# Patient Record
Sex: Female | Born: 2000 | Race: Black or African American | Hispanic: No | Marital: Single
Health system: Southern US, Community
[De-identification: ages and names within clinical notes are randomized; demographics above are authoritative.]

---
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
## 2019-11-06 ENCOUNTER — Ambulatory Visit: Payer: Medicaid Other | Attending: Internal Medicine

## 2019-11-06 DIAGNOSIS — Z23 Encounter for immunization: Secondary | ICD-10-CM

## 2019-11-06 NOTE — Progress Notes (Signed)
   Covid-19 Vaccination Clinic  Name:  Jacqueline Ritter    MRN: [id number] DOB: 02-May-2001  11/06/2019  Ms. Jacqueline Ritter was observed post Covid-19 immunization for 15 minutes without incident. She was provided with Vaccine Information Sheet and instruction to access the V-Safe system.   Ms. Jacqueline Ritter was instructed to call 911 with any severe reactions post vaccine: Marland Kitchen Difficulty breathing  . Swelling of face and throat  . A fast heartbeat  . A bad rash all over body  . Dizziness and weakness   Immunizations Administered    Name Date Dose VIS Date Route   Pfizer COVID-19 Vaccine 11/06/2019  3:41 PM 0.3 mL 07/09/2019 Intramuscular   Manufacturer: [employer], [employer]   Lot: [id number]   NDC: [address]-2

## 2019-11-30 ENCOUNTER — Ambulatory Visit: Payer: Medicaid Other | Attending: Internal Medicine

## 2019-11-30 DIAGNOSIS — Z23 Encounter for immunization: Secondary | ICD-10-CM

## 2019-11-30 NOTE — Progress Notes (Signed)
   Covid-19 Vaccination Clinic  Name:  [name]    MRN: [id number] DOB: 01-02-2001  11/30/2019  [name] was observed post Covid-19 immunization for 15 minutes without incident. She was provided with Vaccine Information Sheet and instruction to access the V-Safe system.   [name] was instructed to call 911 with any severe reactions post vaccine: Marland Kitchen Difficulty breathing  . Swelling of face and throat  . A fast heartbeat  . A bad rash all over body  . Dizziness and weakness   Immunizations Administered    Name Date Dose VIS Date Route   Pfizer COVID-19 Vaccine 11/30/2019 10:03 AM 0.3 mL 09/22/2018 Intramuscular   Manufacturer: [employer], [employer]   Lot: [id number]   NDC: [address]-1

## 2023-07-05 ENCOUNTER — Inpatient Hospital Stay: Payer: Medicaid Other

## 2023-07-05 ENCOUNTER — Inpatient Hospital Stay: Payer: Medicaid Other | Attending: Hematology and Oncology | Admitting: Hematology and Oncology

## 2023-07-05 ENCOUNTER — Encounter: Payer: Self-pay | Admitting: Hematology and Oncology

## 2023-07-05 VITALS — BP 126/81 | HR 100 | Temp 98.8°F | Resp 16 | Ht 66.0 in | Wt 179.4 lb

## 2023-07-05 DIAGNOSIS — E559 Vitamin D deficiency, unspecified: Secondary | ICD-10-CM | POA: Insufficient documentation

## 2023-07-05 DIAGNOSIS — N920 Excessive and frequent menstruation with regular cycle: Secondary | ICD-10-CM | POA: Diagnosis not present

## 2023-07-05 DIAGNOSIS — D509 Iron deficiency anemia, unspecified: Secondary | ICD-10-CM

## 2023-07-05 LAB — IRON AND IRON BINDING CAPACITY (CC-WL,HP ONLY)
Iron: 15 ug/dL — ABNORMAL LOW (ref 28–170)
Saturation Ratios: 3 % — ABNORMAL LOW (ref 10.4–31.8)
TIBC: 458 ug/dL — ABNORMAL HIGH (ref 250–450)
UIBC: 443 ug/dL — ABNORMAL HIGH (ref 148–442)

## 2023-07-05 LAB — FERRITIN: Ferritin: 3 ng/mL — ABNORMAL LOW (ref 11–307)

## 2023-07-05 NOTE — Progress Notes (Signed)
[name] CONSULT NOTE  Patient Care Team: Christel Mormon, MD as PCP - General (Pediatrics)  CHIEF COMPLAINTS/PURPOSE OF CONSULTATION:  IDA  ASSESSMENT & PLAN:  No problem-specific Assessment & Plan notes found for this encounter.  Orders Placed This Encounter  Procedures   Iron and Iron Binding Capacity ([facility] only)    Standing Status:   Future    Standing Expiration Date:   07/04/2024   Ferritin    Standing Status:   Future    Standing Expiration Date:   07/04/2024   This is a pleasant 22 year old premenopausal female patient with no significant past medical history except for anemia referred to hematology for evaluation and recommendations regarding the same.  Iron Deficiency Anemia Asymptomatic, no pica. Likely secondary to heavy menstruation. Previously responsive to oral iron supplementation. No indication for iron infusion. -Start over-the-counter iron pills (65mg /325mg  formulation) daily.  Discussed adverse effects including dyspepsia, constipation, black stool. -Draw iron panel and ferritin today for baseline. -Return in 3-4 months for follow-up and repeat labs.  Menorrhagia Likely contributing to iron deficiency anemia. -Consult with gynecologist to discuss management options to reduce menstrual bleeding.  Vitamin D Deficiency Currently on Vitamin D 50,000 units weekly. -Continue current regimen.  She expressed understanding of all the recommendations.  Thank you for consulting [place] in the care of this patient.  Please do not hesitate to contact us with any additional questions or concerns  HISTORY OF PRESENTING ILLNESS:  Jacqueline Ritter 22 y.o. female is here because of IDA  The patient, with a history of iron deficiency anemia, presents for evaluation of her anemia. She reports no specific symptoms related to her anemia and does not have any cravings for ice. She was referred for iron infusion due to severe anemia detected on recent blood work.  However, she does not report any severe symptoms such as feeling like she is going to pass out, SOB with exertion.She is going to the gym and is able to do a work out. She has previously taken iron pills for her anemia and is willing to take them again. She also reports taking Vitamin D supplements once a week. She denies any other major health issues or surgeries. She does not take any other medications apart from the Vitamin D. She reports no bleeding from anywhere else other than heavy menstrual cycles and consumes meat in her diet. She denies any hair loss or brittle nails. Rest of the pertinent 10 point ROS reviewed and neg.  MEDICAL HISTORY:  No past medical history on file.  SURGICAL HISTORY: No past surgical history on file.  SOCIAL HISTORY: Social History   Socioeconomic History   Marital status: Single    Spouse name: Not on file   Number of children: Not on file   Years of education: Not on file   Highest education level: Not on file  Occupational History   Not on file  Tobacco Use   Smoking status: Never    Passive exposure: Yes   Smokeless tobacco: Never  Substance and Sexual Activity   Alcohol use: No   Drug use: Never   Sexual activity: Not Currently  Other Topics Concern   Not on file  Social History Narrative   Not on file   Social Determinants of Health   Financial Resource Strain: Not on file  Food Insecurity: No Food Insecurity (07/05/2023)   Hunger Vital Sign    Worried About Running Out of Food in the Last Year: Never true  Ran Out of Food in the Last Year: Never true  Transportation Needs: No Transportation Needs (07/05/2023)   PRAPARE - Administrator, Civil Service (Medical): No    Lack of Transportation (Non-Medical): No  Physical Activity: Not on file  Stress: Not on file  Social Connections: Not on file  Intimate Partner Violence: Not At Risk (07/05/2023)   Humiliation, Afraid, Rape, and Kick questionnaire    Fear of Current or  Ex-Partner: No    Emotionally Abused: No    Physically Abused: No    Sexually Abused: No    FAMILY HISTORY: No family history on file.  ALLERGIES:  has No Known Allergies.  MEDICATIONS:  Current Outpatient Medications  Medication Sig Dispense Refill   ergocalciferol (VITAMIN D2) 1.25 MG (50000 UT) capsule Take 50,000 Units by mouth once a week.     No current facility-administered medications for this visit.     PHYSICAL EXAMINATION: ECOG PERFORMANCE STATUS: 0 - Asymptomatic  Vitals:   07/05/23 0858  BP: 126/81  Pulse: 100  Resp: 16  Temp: 98.8 F (37.1 C)  SpO2: 100%   Filed Weights   07/05/23 0858  Weight: 179 lb 6.4 oz (81.4 kg)    GENERAL:alert, no distress and comfortable SKIN: skin color, texture, turgor are normal, no rashes or significant lesions EYES: normal, conjunctiva are pink and non-injected, sclera clear OROPHARYNX:no exudate, no erythema and lips, buccal mucosa, and tongue normal  NECK: supple, thyroid normal size, non-tender, without nodularity LYMPH:  no palpable lymphadenopathy in the cervical, axillary LUNGS: clear to auscultation and percussion with normal breathing effort HEART: regular rate & rhythm and no murmurs and no lower extremity edema ABDOMEN:abdomen soft, non-tender and normal bowel sounds Musculoskeletal:no cyanosis of digits and no clubbing  PSYCH: alert & oriented x 3 with fluent speech NEURO: no focal motor/sensory deficits  LABORATORY DATA:  I have reviewed the data as listed No results found for: "WBC", "HGB", "HCT", "MCV", "PLT"   Chemistry   No results found for: "NA", "K", "CL", "CO2", "BUN", "CREATININE", "GLU" No results found for: "CALCIUM", "ALKPHOS", "AST", "ALT", "BILITOT"     RADIOGRAPHIC STUDIES: I have personally reviewed the radiological images as listed and agreed with the findings in the report. No results found.  All questions were answered. The patient knows to call the clinic with any problems,  questions or concerns. I spent 30 minutes in the care of this patient including H and P, review of records, counseling and coordination of care.     Rachel Moulds, MD 07/05/2023 9:12 AM

## 2023-07-10 ENCOUNTER — Other Ambulatory Visit: Payer: Self-pay | Admitting: Hematology and Oncology

## 2023-10-17 ENCOUNTER — Telehealth: Payer: Self-pay

## 2023-10-17 ENCOUNTER — Other Ambulatory Visit: Payer: Self-pay

## 2023-10-17 DIAGNOSIS — D509 Iron deficiency anemia, unspecified: Secondary | ICD-10-CM

## 2023-10-17 NOTE — Telephone Encounter (Signed)
 Left message on patient voicemail about appointment on 10/20/23

## 2023-10-20 ENCOUNTER — Inpatient Hospital Stay: Payer: Medicaid Other | Admitting: Hematology and Oncology

## 2023-10-20 ENCOUNTER — Inpatient Hospital Stay: Payer: Medicaid Other
